# Patient Record
Sex: Female | Born: 1995 | Race: White | Hispanic: No | Marital: Single | Smoking: Never smoker
Health system: Southern US, Community
[De-identification: ages and names within clinical notes are randomized; demographics above are authoritative.]

## PROBLEM LIST (undated history)

## (undated) DIAGNOSIS — F419 Anxiety disorder, unspecified: Secondary | ICD-10-CM

## (undated) DIAGNOSIS — Q249 Congenital malformation of heart, unspecified: Secondary | ICD-10-CM

## (undated) HISTORY — DX: Congenital malformation of heart, unspecified: Q24.9

## (undated) HISTORY — PX: MOUTH SURGERY: SHX715

## (undated) HISTORY — PX: CARDIAC SURGERY: SHX584

## (undated) HISTORY — PX: TONSILLECTOMY: SUR1361

---
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
## 2015-05-04 ENCOUNTER — Ambulatory Visit (INDEPENDENT_AMBULATORY_CARE_PROVIDER_SITE_OTHER): Payer: Managed Care, Other (non HMO) | Admitting: Physician Assistant

## 2015-05-04 VITALS — BP 112/66 | HR 62 | Temp 99.3°F | Resp 16 | Ht 64.0 in | Wt 120.0 lb

## 2015-05-04 DIAGNOSIS — R4587 Impulsiveness: Secondary | ICD-10-CM | POA: Diagnosis not present

## 2015-05-04 DIAGNOSIS — Z0000 Encounter for general adult medical examination without abnormal findings: Secondary | ICD-10-CM | POA: Diagnosis not present

## 2015-05-04 DIAGNOSIS — Z23 Encounter for immunization: Secondary | ICD-10-CM | POA: Diagnosis not present

## 2015-05-04 DIAGNOSIS — R4681 Obsessive-compulsive behavior: Secondary | ICD-10-CM

## 2015-05-04 DIAGNOSIS — F419 Anxiety disorder, unspecified: Secondary | ICD-10-CM | POA: Diagnosis not present

## 2015-05-04 MED ORDER — FLUOXETINE HCL 20 MG PO TABS: 20.0000 mg | ORAL_TABLET | Freq: Every day | ORAL | Status: AC

## 2015-05-04 MED ORDER — FLUOXETINE HCL 20 MG PO TABS: 20.0000 mg | ORAL_TABLET | Freq: Every day | ORAL | Status: DC

## 2015-05-04 NOTE — Patient Instructions (Signed)
I would like you to start the fluoxetine.  I want you to be aware of your mood--if you are having increased energy to the point of euphoria, up all night, impulsive behavior picking up, mood changes.  I want to see you in 10 days to see how you are doing.  You can call the office and schedule with the appointments with me.  This will be next door in [address].  If you need to speak sooner, then just contact me.  I am referring you to a psychiatrist to hopefully be seen within the next 3 weeks for a more formal diagnosis.    Generalized Anxiety Disorder Generalized anxiety disorder (GAD) is a mental disorder. It interferes with life functions, including relationships, work, and school. GAD is different from normal anxiety, which everyone experiences at some point in their lives in response to specific life events and activities. Normal anxiety actually helps [place] prepare for and get through these life events and activities. Normal anxiety goes away after the event or activity is over.  GAD causes anxiety that is not necessarily related to specific events or activities. It also causes excess anxiety in proportion to specific events or activities. The anxiety associated with GAD is also difficult to control. GAD can vary from mild to severe. People with severe GAD can have intense waves of anxiety with physical symptoms (panic attacks).  SYMPTOMS The anxiety and worry associated with GAD are difficult to control. This anxiety and worry are related to many life events and activities and also occur more days than not for 6 months or longer. People with GAD also have three or more of the following symptoms (one or more in children):  Restlessness.   Fatigue.  Difficulty concentrating.   Irritability.  Muscle tension.  Difficulty sleeping or unsatisfying sleep. DIAGNOSIS GAD is diagnosed through an assessment by your health care provider. Your health care provider will ask you questions aboutyour  mood,physical symptoms, and events in your life. Your health care provider may ask you about your medical history and use of alcohol or drugs, including prescription medicines. Your health care provider may also do a physical exam and blood tests. Certain medical conditions and the use of certain substances can cause symptoms similar to those associated with GAD. Your health care provider may refer you to a mental health specialist for further evaluation. TREATMENT The following therapies are usually used to treat GAD:   Medication. Antidepressant medication usually is prescribed for long-term daily control. Antianxiety medicines may be added in severe cases, especially when panic attacks occur.   Talk therapy (psychotherapy). Certain types of talk therapy can be helpful in treating GAD by providing support, education, and guidance. A form of talk therapy called cognitive behavioral therapy can teach you healthy ways to think about and react to daily life events and activities.  Stress managementtechniques. These include yoga, meditation, and exercise and can be very helpful when they are practiced regularly. A mental health specialist can help determine which treatment is best for you. Some people see improvement with one therapy. However, other people require a combination of therapies. Document Released: 01/21/2013 Document Revised: 02/10/2014 Document Reviewed: 01/21/2013 [facility] Patient Information 2015 [place], [place]. This information is not intended to replace advice given to you by your health care provider. Make sure you discuss any questions you have with your health care provider.

## 2015-05-04 NOTE — Progress Notes (Signed)
[facility] [address], [place] [address] [phone]  Date:  05/04/2015   Name:  Cristina Morris   DOB:  [birth year]   MRN:  [id number]  PCP:  Winefred Myrtle, MD    History of Present Illness:  Cristina Morris is a 19 y.o. female patient who presents to [facility] for a physical exam, hep b injection, and depression medication.  -Patient would like to discuss being placed on medication for her anxiety/depression. She sees Dr. Eugenia Pancoast, a psychologist, regularly. The psychologist recommended that she be placed on a medication at this time. Patient states that he has witnessed some behavioral changes over the last 4 months. Patient states that she feels very fatigued. Her appetite has changed. She is not able to eat in the morning and may sometimes fill even like her stomach is turning. She has difficulty with sleeping. She feels very anxious at times.  She endorses intrusive thoughts of this visions of flash and twisting metal. She has no homicidal or suicidal ideations. She states that she has had shoplifting charges about 4 months ago and has had 4 sexual encounters in the last 4 months. These were people known to her, but it is to note that these 4 sexual encounters are the only sexual activity she's ever had. She has sexual thoughts of "wanting to fuck every body" but does not act on it.  There is history of suicidal ideation where she was hospitalized at the behavioral clinic. She states that she thought that this was blown out of proportion. She was on OCPs, which she thinks altered her behaviors and when she discontinued taking the medication she felt much better and her symptoms went away.  She [facility]am [facility] which she states helps with her anxiety. However, she started using them as a sleep aid, which made her high strung.   She denies any visual hallucinations or auditory hallucinations. She has good relationships with her coworkers and friends,  and family.   Relationship with mother is strained however. No documentation, but there is high suspicion that mother has borderline personality disorder. There is also concern that there is a similar behavioral disorder with maternal grandmother.   She does not have difficulty with things unorganized. There is nothing that is repetitive or behaviors that may delay or calls difficulty in her daily life due to any fixation.   She feels as if her mind is racing. She has trouble with sleeping. She goes to bed around midnight and wakes around 5 or 6. She denies being up doing a lot of task at night. She denies delusion or grandiosity.   patient is concerned to start medication. 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 The last time she was in college, she felt very overwhelmed by her course load. She spent 2 weeks in her dorm room, depressed, and anxious.   She claims that her metabolism has increased since discontinuing the new for ring at the end of the year. Now she has decreased appetite with some nausea in the morning time. This is gone on for several months. She denies any eating disorder including pinching, binging and purging, or anorexia.    Wt Readings from Last 3 Encounters:  05/04/15 120 lb (54.432 kg) (37 %*, Z = -0.33)  07/15/14 129 lb 9.6 oz (58.786 kg) (60 %*, Z = 0.25)  06/09/14 130 lb 9.6 oz (59.24  kg) (62 %*, Z = 0.30)   * Growth percentiles are based on CDC 2-20 Years data.      There are no active problems to display for this patient.   Past Medical History  Diagnosis Date  . Heart defect     ASD/BSD  . Anxiety     Past Surgical History  Procedure Laterality Date  . Tonsillectomy    . Cardiac surgery    . Mouth surgery      History  Substance Use Topics  . Smoking status: Never Smoker   . Smokeless tobacco: Not on file  . Alcohol Use: No    Family History  Problem Relation Age of Onset  . Cancer Paternal  Grandfather   . Hypertension Paternal Grandfather   . Cerebral palsy Paternal Grandmother   . Cancer Paternal Grandmother   . Mental illness Mother     borderline personality disorder  . Asthma Brother   . Migraines Brother   . Diabetes Cousin     Allergies  Allergen Reactions  . Amoxicillin     Possible allergy- she developed a rash shortly after taking a course of amox  . Latex Swelling    Medication list has been reviewed and updated.  Current Outpatient Prescriptions on File Prior to Visit  Medication Sig Dispense Refill  . albuterol (PROVENTIL HFA;VENTOLIN HFA) 108 (90 BASE) MCG/ACT inhaler Inhale 2 puffs into the lungs every 4 (four) hours as needed for wheezing or shortness of breath (cough, shortness of breath or wheezing.). 1 Inhaler 5  . etonogestrel-ethinyl estradiol (NUVARING) 0.12-0.015 MG/24HR vaginal ring Place 1 each vaginally every 28 (twenty-eight) days. Insert vaginally and leave in place for 3 consecutive weeks, then remove for 1 week.     No current facility-administered medications on file prior to visit.    ROS ROS otherwise unremarkable unless listed above.   Physical Examination: BP 112/66 mmHg  Pulse 62  Temp(Src) 99.3 F (37.4 C)  Resp 16  Ht 5\' 4"  (1.626 m)  Wt 120 lb (54.432 kg)  BMI 20.59 kg/m2  SpO2   LMP 05/04/2015 Ideal Body Weight: Weight in (lb) to have BMI = 25: 145.3  Physical Exam Alert, cooperative, oriented 4 in no acute distress. PERRLA with normal conjunctiva.  No thyromegaly detected in without any masses. Breath sounds are normal without wheezing or rhonchi. Regular rate and rhythm without murmurs, rubs, or gallops. Normal bowel sounds with belly soft. No tenderness in all quadrants. No hepatosplenomegaly. No peripheral edema. Extremity pulses are normal. Cranial nerves intact. Normal gait. Normal tandem gait. Normal range of motion and strength. No homicidal or suicidal ideation. No rapid speech nor slurred. Normal affect. No  appearance of cognitive delay.  Assessment and Plan: 19 year old female is here to discuss depression/anxiety medication, physical exam, and to have hep b vacc.  We will start her on fluoxetine. She will follow-up in 4-6 weeks. She was given symptoms that may warrant a more immediate return including rash and behavioral changes. There is possibility for bipolar disorder and there is concern for possible manic episode secondary to SSRI and this was discussed. I'm referring her to psychiatry in [facility] for a more formal diagnosis. I'm contacting Dr. Eugenia Pancoast, her psychologist, to alert her of this new medication. She is saying her psychologist regularly which will help monitor behaviors. We will test her thyroid shows to ensure that this is not pushing symptoms.  1. Anxiety disorder, unspecified anxiety disorder type - FLUoxetine (PROZAC) 20 MG  tablet; Take 1 tablet (20 mg total) by mouth daily.  Dispense: 30 tablet; Refill: 0 - Ambulatory referral to Psychiatry  2. Obsessive-compulsive behavior - FLUoxetine (PROZAC) 20 MG tablet; Take 1 tablet (20 mg total) by mouth daily.  Dispense: 30 tablet; Refill: 0 - Ambulatory referral to Psychiatry  3. Need for hepatitis B vaccination - Hepatitis B vaccine adult IM  4. Annual physical exam - Hepatitis B vaccine adult IM - TSH - CBC  5. Impulsive - Ambulatory referral to Psychiatry

## 2015-05-05 LAB — CBC
HCT: 42.5 % (ref 36.0–46.0)
Hemoglobin: 14.4 g/dL (ref 12.0–15.0)
MCH: 30.3 pg (ref 26.0–34.0)
MCHC: 33.9 g/dL (ref 30.0–36.0)
MCV: 89.3 fL (ref 78.0–100.0)
MPV: 10.5 fL (ref 8.6–12.4)
Platelets: 324 10*3/uL (ref 150–400)
RBC: 4.76 MIL/uL (ref 3.87–5.11)
RDW: 13.5 % (ref 11.5–15.5)
WBC: 6.1 10*3/uL (ref 4.0–10.5)

## 2015-05-05 LAB — TSH: TSH: 1.002 u[IU]/mL (ref 0.350–4.500)

## 2015-05-07 ENCOUNTER — Telehealth: Payer: Self-pay | Admitting: Physician Assistant

## 2015-05-07 NOTE — Telephone Encounter (Signed)
Contacted Dr. Eugenia Pancoast to discuss patient and to advise of the start of new med, and signs to look out for.  Left message

## 2019-05-24 ENCOUNTER — Other Ambulatory Visit: Payer: Self-pay | Admitting: Orthopedic Surgery

## 2019-05-24 DIAGNOSIS — M25552 Pain in left hip: Secondary | ICD-10-CM

## 2019-05-29 ENCOUNTER — Other Ambulatory Visit: Payer: Self-pay

## 2019-05-29 ENCOUNTER — Ambulatory Visit
Admission: RE | Admit: 2019-05-29 | Discharge: 2019-05-29 | Disposition: A | Discharge status: Home / Self Care | Payer: 59 | Source: Ambulatory Visit | Attending: Orthopedic Surgery | Admitting: Orthopedic Surgery

## 2019-05-29 DIAGNOSIS — M25552 Pain in left hip: Secondary | ICD-10-CM

## 2021-07-10 IMAGING — CT CT OF THE LEFT HIP WITHOUT CONTRAST
1 series · 15 of 32 positions shown, 19 images · non-contrast
Comparison: None

CLINICAL DATA: Evaluate. Traumatic pelvic fractures x 2 yrs ago,
now concern that screw is rubbing pubic area, left groin pain

EXAM:
CT OF THE LEFT HIP WITHOUT CONTRAST
CT - 3-DIMENSIONAL CT IMAGE RENDERING ON INDEPENDENT
TECHNIQUE: Multidetector CT imaging of the left hip was performed according to
the standard protocol. Multiplanar CT image reconstructions were
also generated.

[Series 4: soft tissue pelvis/hip (person_name) · axial · 0.38mm/px · z∈[-228,-27]mm · 15 of 75 slices shown, 19 images]
[im 5/75  soft-tissue]
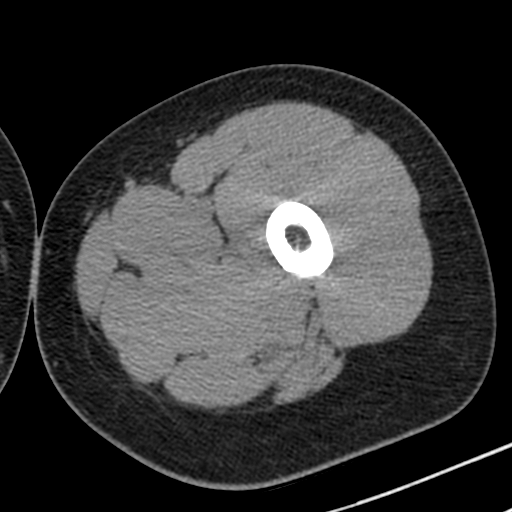
[im 5/75  bone]
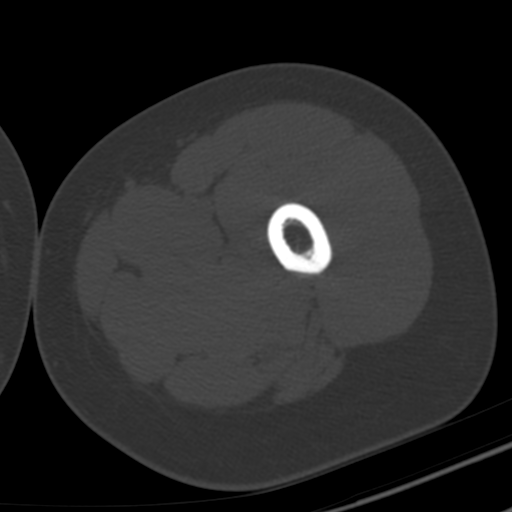
[im 10/75  soft-tissue]
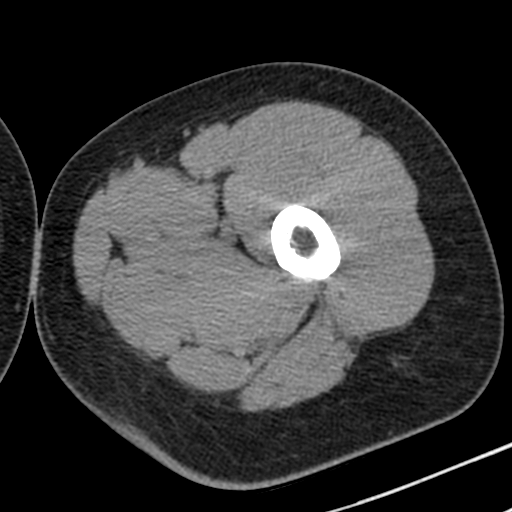
[im 15/75  soft-tissue]
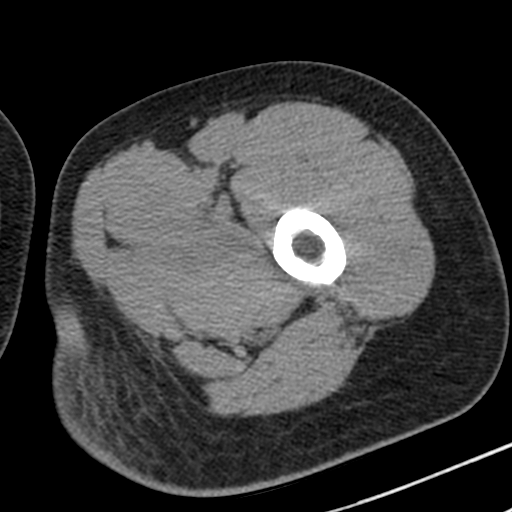
[im 22/75  soft-tissue]
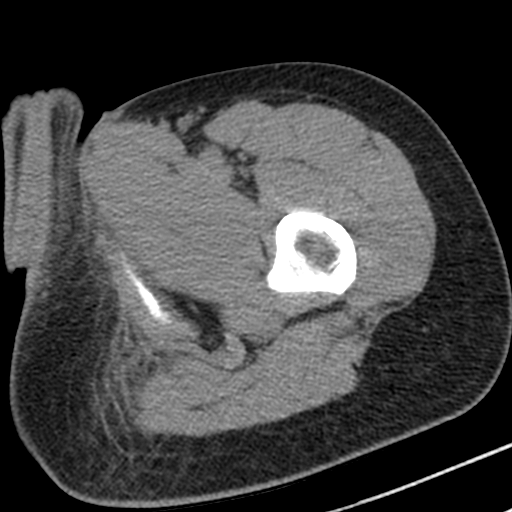
[im 27/75  soft-tissue]
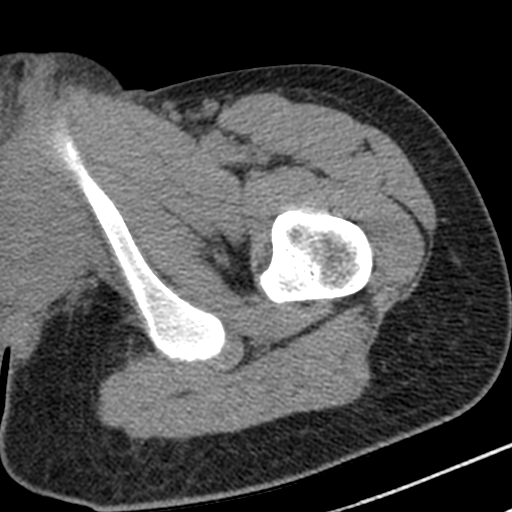
[im 32/75  soft-tissue]
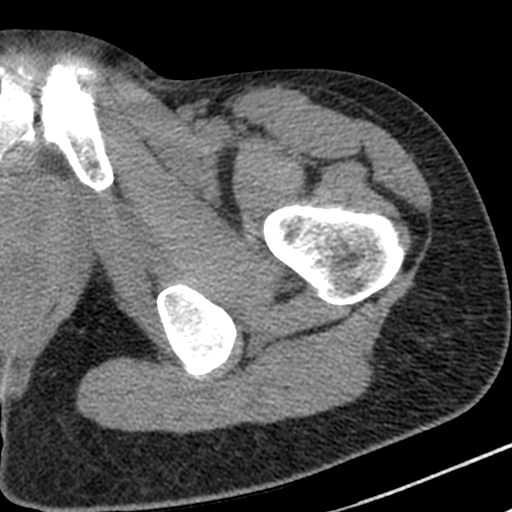
[im 39/75  soft-tissue]
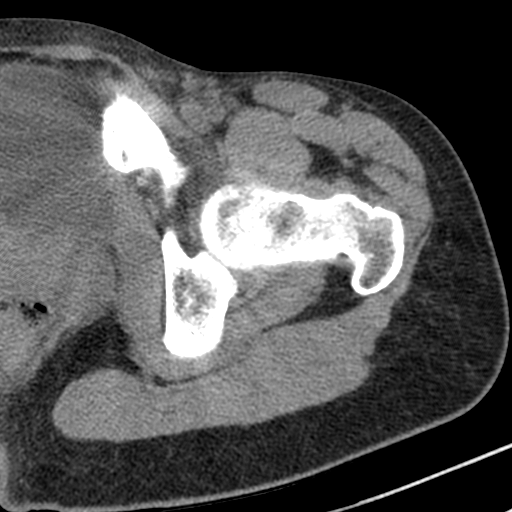
[im 43/75  soft-tissue]
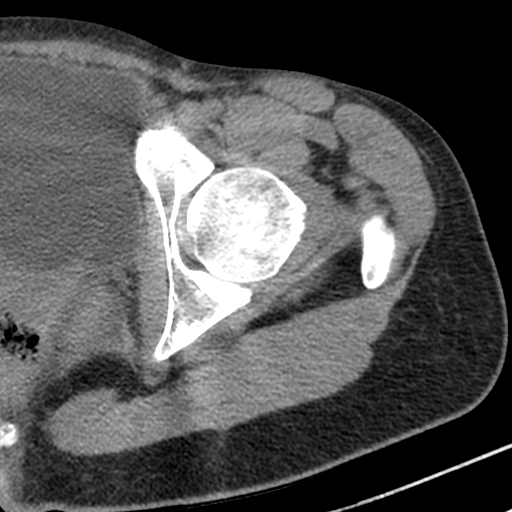
[im 48/75  soft-tissue]
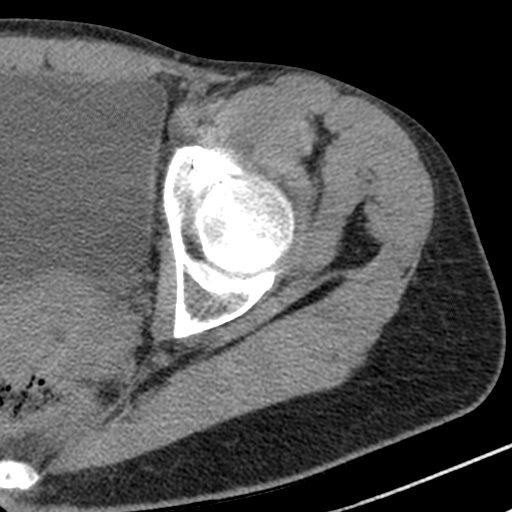
[im 48/75  bone]
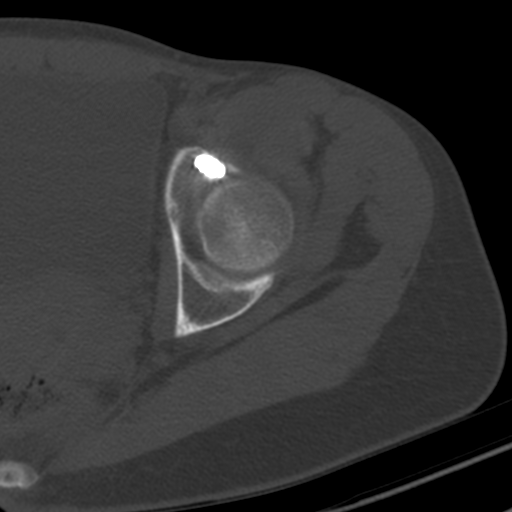
[im 53/75  soft-tissue]
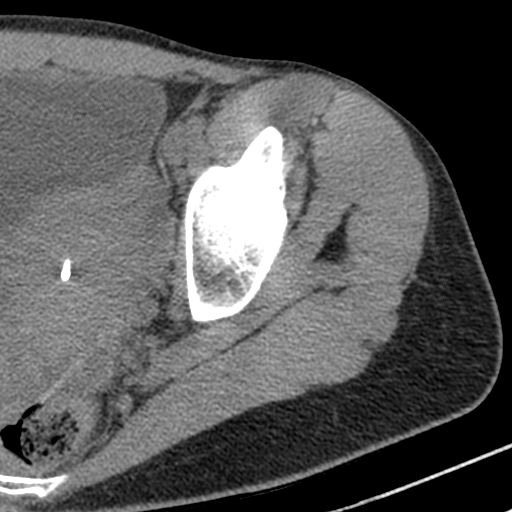
[im 60/75  soft-tissue]
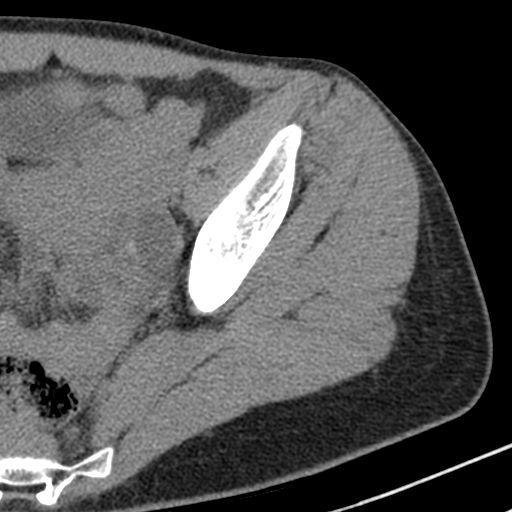
[im 65/75  soft-tissue]
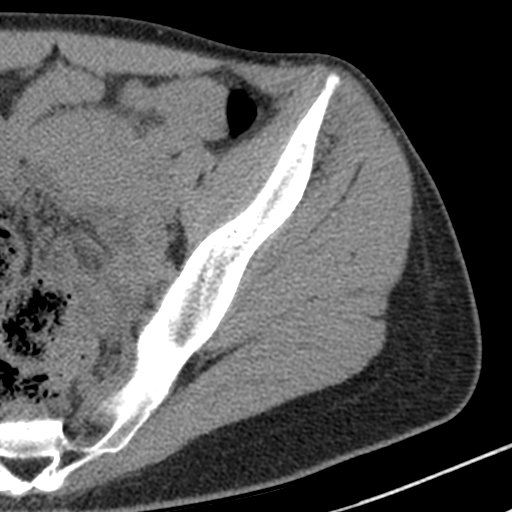
[im 65/75  lung]
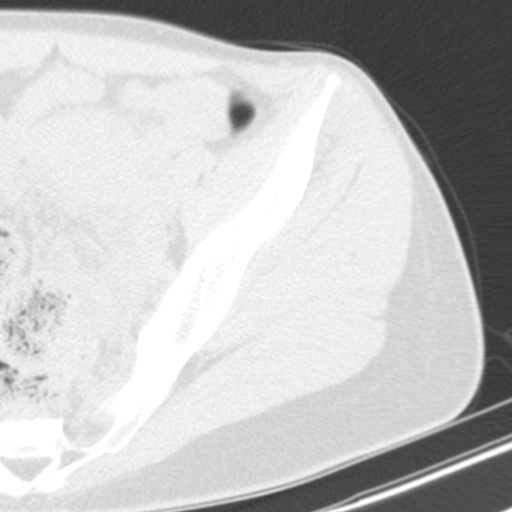
[im 67/75  lung]
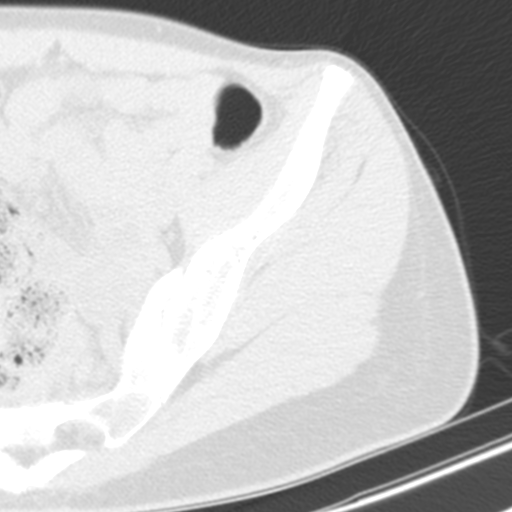
[im 70/75  soft-tissue]
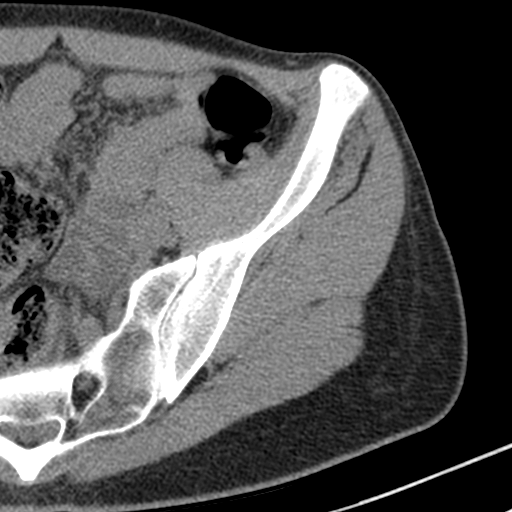
[im 70/75  lung]
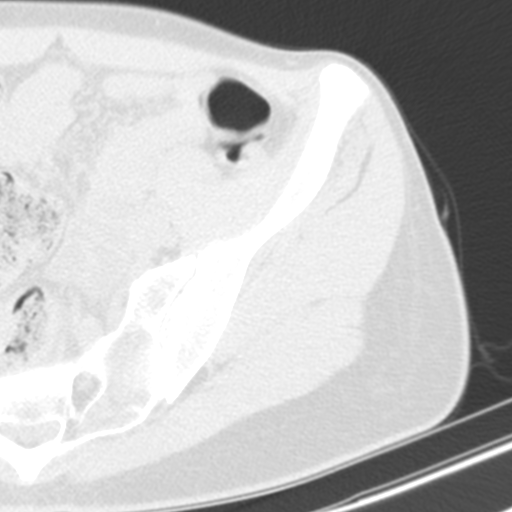
[im 72/75  lung]
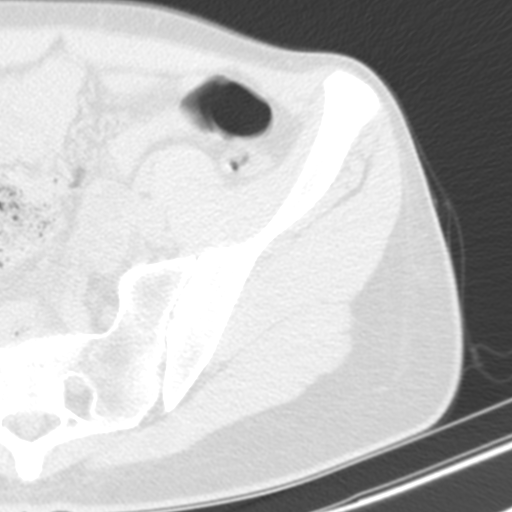

[15 of 32 positions shown; findings below may reference images not displayed]

FINDINGS: Bones/Joint/Cartilage

No acute fracture or dislocation.

Single fully threaded screw transfixing the left superior pubic
ramus. No hardware failure or complication. No backing out of the
screw. The screw along the pubic aspect is located at the rectus
abdominus-adductor aponeurosis. Normal alignment. No joint effusion.

Ligaments

Ligaments are suboptimally evaluated by CT.

Muscles and Tendons
Muscles are normal. No muscle atrophy. No intramuscular fluid
collection or hematoma.

Soft tissue
No fluid collection or hematoma.  No soft tissue mass.
IMPRESSION: 1. Single fully threaded screw transfixing the left superior pubic
ramus. No hardware failure or complication. No backing out of the
screw. The screw along the pubic aspect is located at the rectus
abdominus-adductor aponeurosis.
